# Patient Record
Sex: Male | Born: 1970 | Race: White | Hispanic: No | Smoking: Former smoker
Health system: Southern US, Community
[De-identification: ages and names within clinical notes are randomized; demographics above are authoritative.]

## PROBLEM LIST (undated history)

## (undated) DIAGNOSIS — N189 Chronic kidney disease, unspecified: Secondary | ICD-10-CM

## (undated) DIAGNOSIS — Z87442 Personal history of urinary calculi: Secondary | ICD-10-CM

## (undated) HISTORY — PX: LITHOTRIPSY: SUR834

---

## 1998-09-19 HISTORY — PX: STONE EXTRACTION WITH BASKET: SHX5318

## 2003-03-13 ENCOUNTER — Encounter: Admission: RE | Admit: 2003-03-13 | Discharge: 2003-03-13 | Payer: Self-pay | Admitting: Family Medicine

## 2003-03-13 ENCOUNTER — Encounter: Payer: Self-pay | Admitting: Family Medicine

## 2005-01-20 ENCOUNTER — Encounter: Admission: RE | Admit: 2005-01-20 | Discharge: 2005-01-20 | Payer: Self-pay | Admitting: Emergency Medicine

## 2005-01-28 ENCOUNTER — Encounter: Admission: RE | Admit: 2005-01-28 | Discharge: 2005-01-28 | Payer: Self-pay | Admitting: Emergency Medicine

## 2005-07-12 ENCOUNTER — Encounter: Admission: RE | Admit: 2005-07-12 | Discharge: 2005-07-12 | Payer: Self-pay | Admitting: Emergency Medicine

## 2005-10-27 ENCOUNTER — Ambulatory Visit (HOSPITAL_COMMUNITY): Admission: RE | Admit: 2005-10-27 | Discharge: 2005-10-27 | Payer: Self-pay | Admitting: Urology

## 2007-06-24 IMAGING — CR DG ABDOMEN 1V
2 series · 2 of 2 positions shown · non-contrast
Comparison: none

CLINICAL DATA: Left ureteral stone, preop workup.
ABDOMEN - 1 VIEW:
Faint, small, oval calcification, left true pelvis, which appears to measure approximately 4 x 7 mm. Unremarkable bowel gas pattern.  
I compared the 10/06/05 CT scan on the [facility] to today?s plain films.  On the CT, the stone was in the distal left ureter at approximately the mid SI joint level.  On the plain films, there is a faint calcification just above the level of the ischial spine, in the true pelvis, not far from the expected position of the left UVJ.  I feel that the stone has moved distally to a slight degree since the prior CT.

[t abdomen supine (1 of 2)]
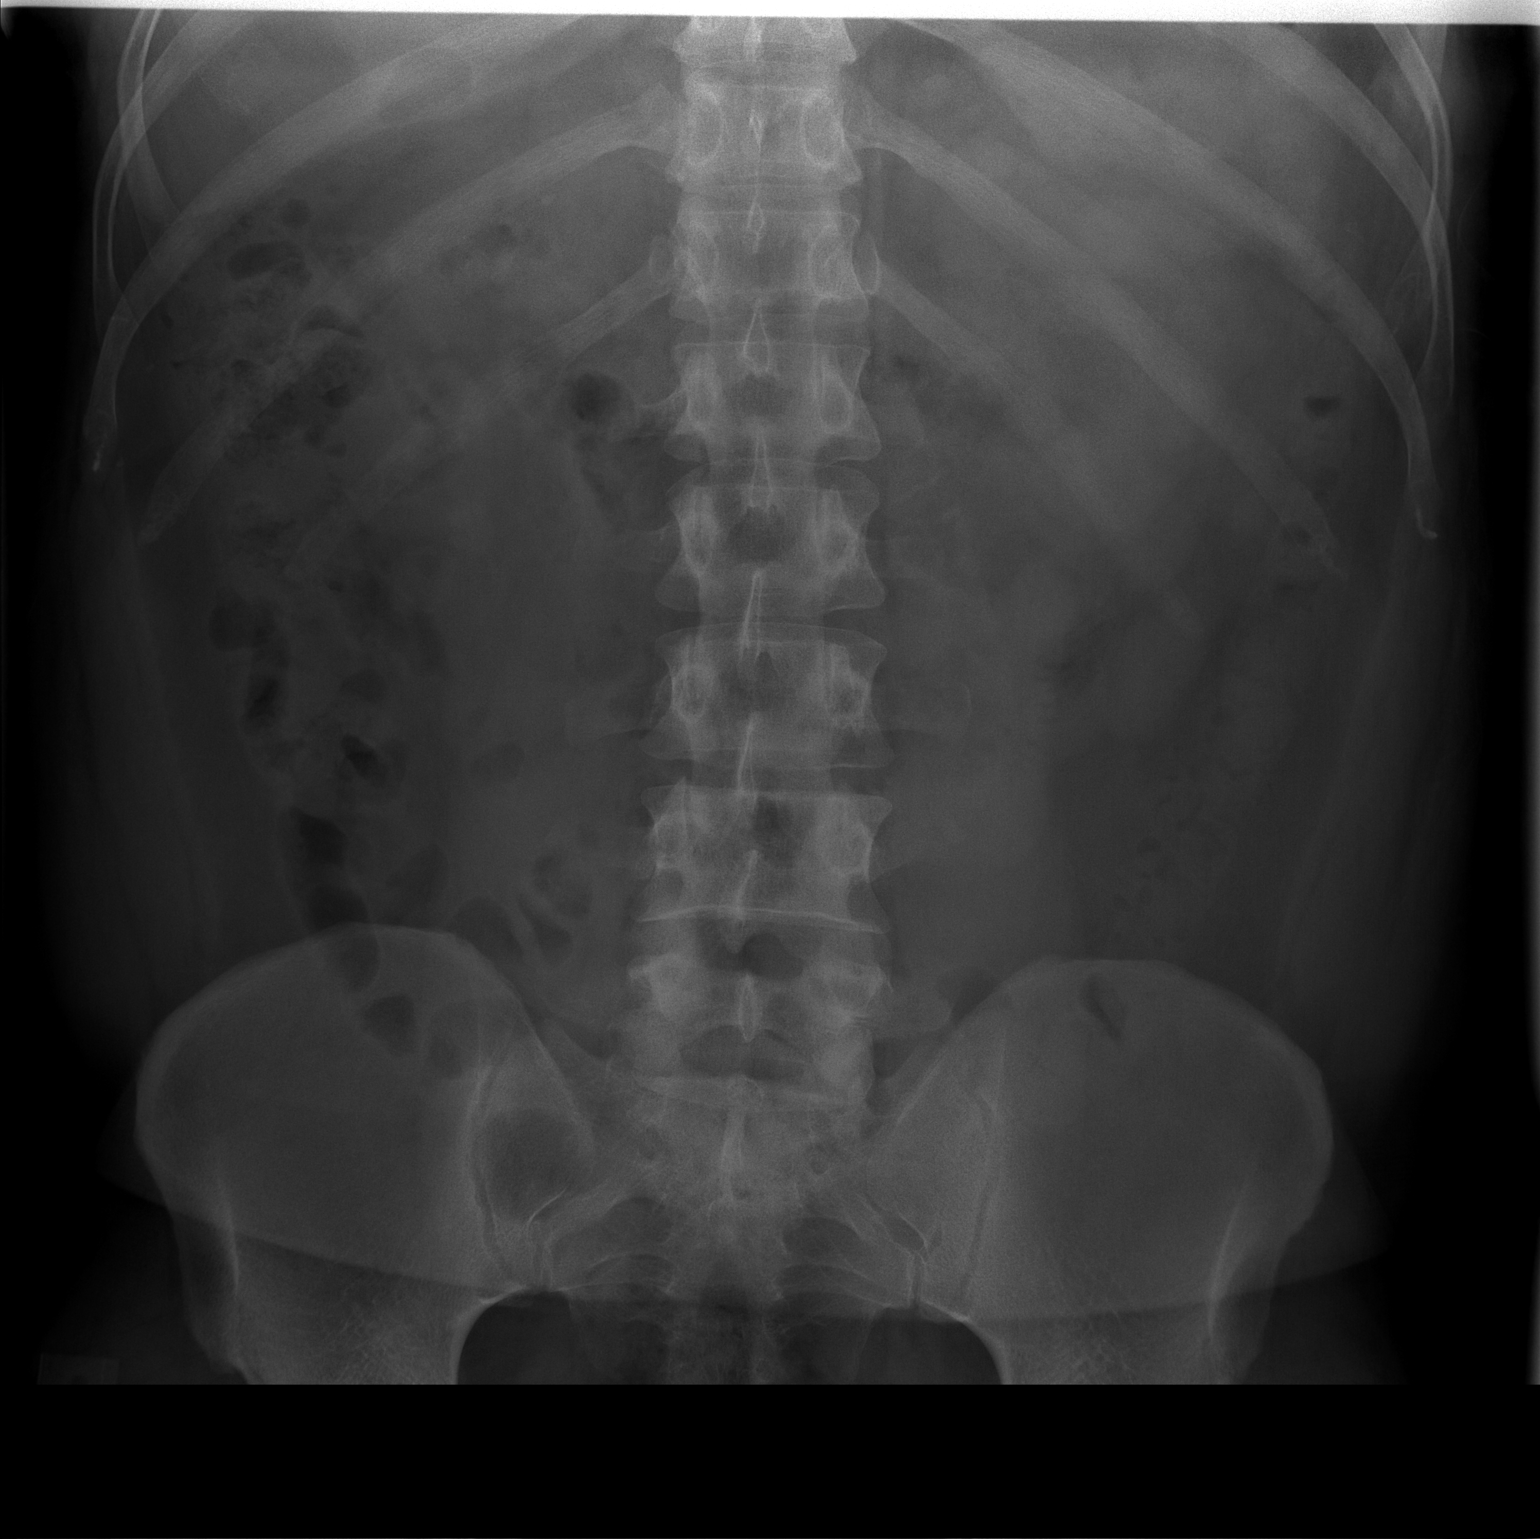

[t abdomen supine (2 of 2)]
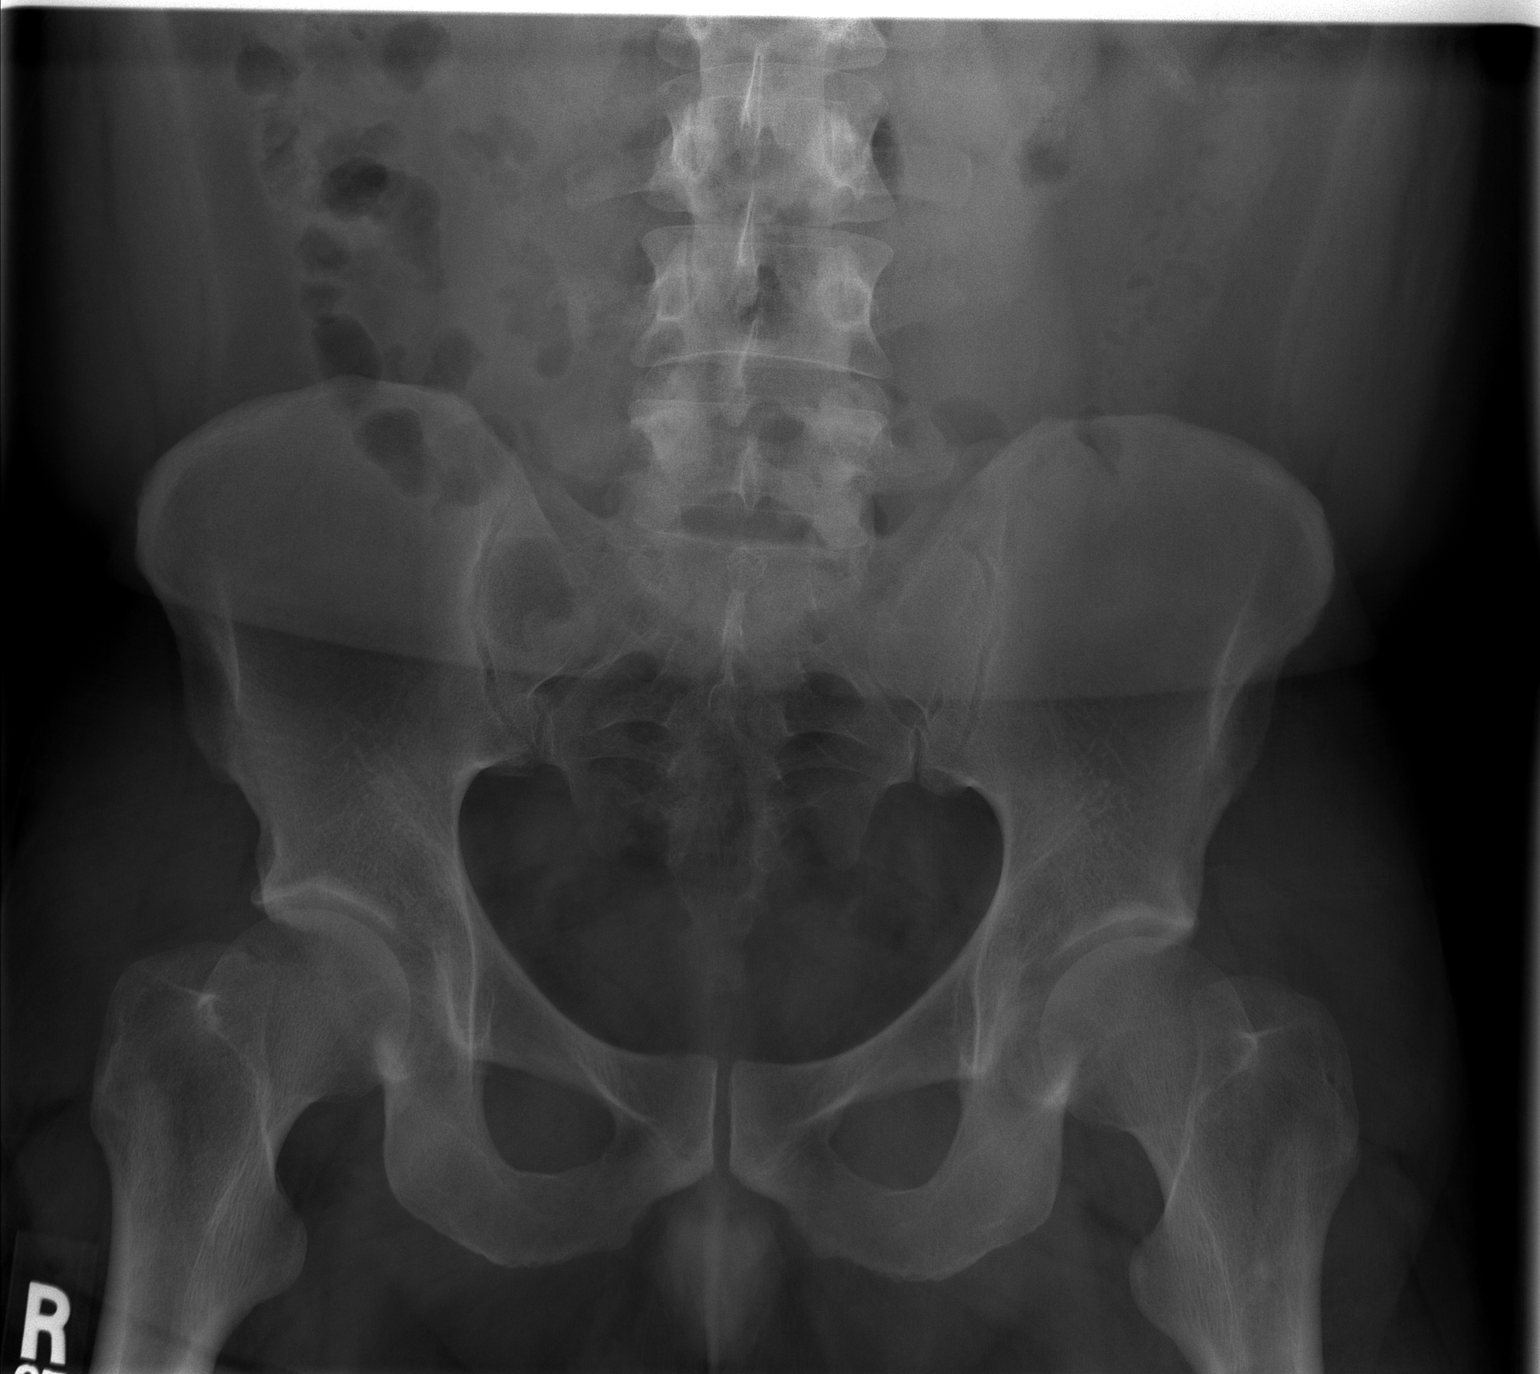

[2 of 2 positions shown; findings below may reference images not displayed]

IMPRESSION: Findings suspicious for small calculus in the distal left ureter near the UVJ.

## 2011-09-26 ENCOUNTER — Encounter (HOSPITAL_COMMUNITY): Payer: Self-pay | Admitting: Pharmacy Technician

## 2011-09-27 ENCOUNTER — Other Ambulatory Visit: Payer: Self-pay | Admitting: Urology

## 2011-09-27 ENCOUNTER — Encounter (HOSPITAL_COMMUNITY): Payer: Self-pay | Admitting: *Deleted

## 2011-09-27 NOTE — Progress Notes (Signed)
Reminded to take laxitive 09/28/11 pm and no Aspirin products from now till after procedure

## 2011-09-29 ENCOUNTER — Encounter (HOSPITAL_COMMUNITY)
Admission: RE | Disposition: A | Discharge status: Home / Self Care | Payer: Self-pay | Source: Ambulatory Visit | Attending: Urology

## 2011-09-29 ENCOUNTER — Ambulatory Visit (HOSPITAL_COMMUNITY): Payer: BC Managed Care – PPO

## 2011-09-29 ENCOUNTER — Encounter (HOSPITAL_COMMUNITY): Payer: Self-pay | Admitting: *Deleted

## 2011-09-29 ENCOUNTER — Ambulatory Visit (HOSPITAL_COMMUNITY)
Admission: RE | Admit: 2011-09-29 | Discharge: 2011-09-29 | Disposition: A | Discharge status: Home / Self Care | Payer: BC Managed Care – PPO | Source: Ambulatory Visit | Attending: Urology | Admitting: Urology

## 2011-09-29 DIAGNOSIS — N201 Calculus of ureter: Secondary | ICD-10-CM

## 2011-09-29 HISTORY — DX: Chronic kidney disease, unspecified: N18.9

## 2011-09-29 SURGERY — LITHOTRIPSY, ESWL
Anesthesia: LOCAL | Laterality: Left

## 2011-09-29 MED ORDER — TAMSULOSIN HCL 0.4 MG PO CAPS: 0.4000 mg | ORAL_CAPSULE | ORAL | Status: AC

## 2011-09-29 MED ORDER — DIAZEPAM 5 MG PO TABS
10.0000 mg | ORAL_TABLET | ORAL | Status: AC
  Administered 2011-09-29: 10 mg via ORAL

## 2011-09-29 MED ORDER — DIPHENHYDRAMINE HCL 25 MG PO CAPS
25.0000 mg | ORAL_CAPSULE | ORAL | Status: AC
  Administered 2011-09-29: 25 mg via ORAL

## 2011-09-29 MED ORDER — HYDROCODONE-ACETAMINOPHEN 10-325 MG PO TABS: 1.0000 | ORAL_TABLET | Freq: Four times a day (QID) | ORAL | Status: AC | PRN

## 2011-09-29 MED ORDER — CIPROFLOXACIN HCL 500 MG PO TABS
500.0000 mg | ORAL_TABLET | ORAL | Status: AC
  Administered 2011-09-29: 500 mg via ORAL

## 2011-09-29 MED ORDER — SODIUM CHLORIDE 0.45 % IV SOLN
INTRAVENOUS | Status: DC
  Administered 2011-09-29: 1000 mL via INTRAVENOUS

## 2011-09-29 NOTE — Interval H&P Note (Signed)
History and Physical Interval Note:  09/29/2011 4:12 PM  Joshua Wilkinson  has presented today for surgery, with the diagnosis of left distal ureteral stone  The various methods of treatment have been discussed with the patient and family. After consideration of risks, benefits and other options for treatment, the patient has consented to  Procedure(s): EXTRACORPOREAL SHOCK WAVE LITHOTRIPSY (ESWL) as a surgical intervention .  The patients' history has been reviewed, patient examined, no change in status, stable for surgery.  I have reviewed the patients' chart and labs.  Questions were answered to the patient's satisfaction.     [place]

## 2011-09-29 NOTE — H&P (Signed)
  History of Present Illness     He has a history of calculus disease. He passed a stone from the right ureter in 2/08.   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  Interval history: He has not seen his stone pass. He has been having discomfort almost on a nightly basis but it's not severe pain. It has required pain medication.   Past Medical History Problems  1. 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 Hydrocodone-Acetaminophen 10-325 MG Oral Tablet; TAKE 1-2 TABLETS EVERY 6 HOURS AS  NEEDED; Therapy: 29Nov2012 to (Complete:05Dec2012); Last Rx:29Nov2012 2. Meloxicam 15 MG Oral Tablet; Therapy: 10Oct2012 to 3. Multivitamins TABS; Therapy: (Recorded:04Feb2008) to  Allergies Medication  1. No Known Drug Allergies  Family History Problems  1. Paternal history of  Family Health Status Number Of Children 1 son 2. Paternal history of  Nephrolithiasis  Social History Problems  1. Alcohol Use less than one 2. Caffeine Use 1-2 3. 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 History of  Tobacco Use V15.82 quit 2 years ago; smoked less than one pack per day  Review of Systems Genitourinary, constitutional and gastrointestinal system(s) were reviewed and pertinent findings if present are noted.  Genitourinary: hematuria.  Gastrointestinal: flank pain, but no nausea and no vomiting.  Constitutional: no fever.    Vitals Vital Signs Blood Pressure: 153 / 109 Temperature: 97.7 F Heart Rate: 63  Physical Exam: General appearance: alert and appears stated age Head: Normocephalic, without obvious abnormality, atraumatic Eyes: conjunctivae/corneas clear. EOM's intact.  Oropharynx: moist mucous membranes Neck: supple, symmetrical, trachea midline Resp: normal  respiratory effort Cardio: regular rate and rhythm Back: symmetric, no curvature. ROM normal. No CVA tenderness. GI: soft, non-tender; bowel sounds normal; no masses,  no organomegaly Male genitalia: penis: normal male phallus with no lesions or discharge. Testes: bilaterally descended with no masses or tenderness. no hernias Extremities: extremities normal, atraumatic, no cyanosis or edema Skin: Skin color normal. No visible rashes or lesions Neurologic: Grossly normal  The following images/tracing/specimen were independently visualized:  KUB: His left distal ureteral stone has progressed somewhat and is now located above the ureterovesical junction.    Assessment Assessed  1. Distal Ureteral Stone On The Left 592.1 2. Nephrolithiasis Of The Left Kidney 592.0   He has not passed his stone yet and has been having intermittent discomfort. He therefore has elected to proceed with lithotripsy. We've gone over the procedure today as well as its risks, complications and the probability of success. He understands and has elected to proceed.   Plan Distal Ureteral Stone On The Left (592.1)  1. Hydrocodone-Acetaminophen 10-325 MG Oral Tablet; TAKE 1-2 TABLETS EVERY 6 HOURS AS  NEEDED; Therapy: 29Nov2012 to (Complete:08Jan2013)  Requested for: 02Jan2013; Last  Rx:02Jan2013 Health Maintenance (V70.0)  2. UA With REFLEX  Done: 02Jan2013 08:06AM   1. I refilled his pain medication. 2. He will be scheduled for lithotripsy of his left distal ureteral stone.

## 2011-09-29 NOTE — Op Note (Signed)
See [facility] OP note scanned into chart. 

## 2013-05-26 IMAGING — CR DG ABDOMEN 1V
2 series · 2 of 2 positions shown · non-contrast
Comparison: 09/21/2011

CLINICAL DATA: Left ureteral stone. Preop for lithotripsy.

ABDOMEN - 1 VIEW

[t abdomen supine (1 of 2)]
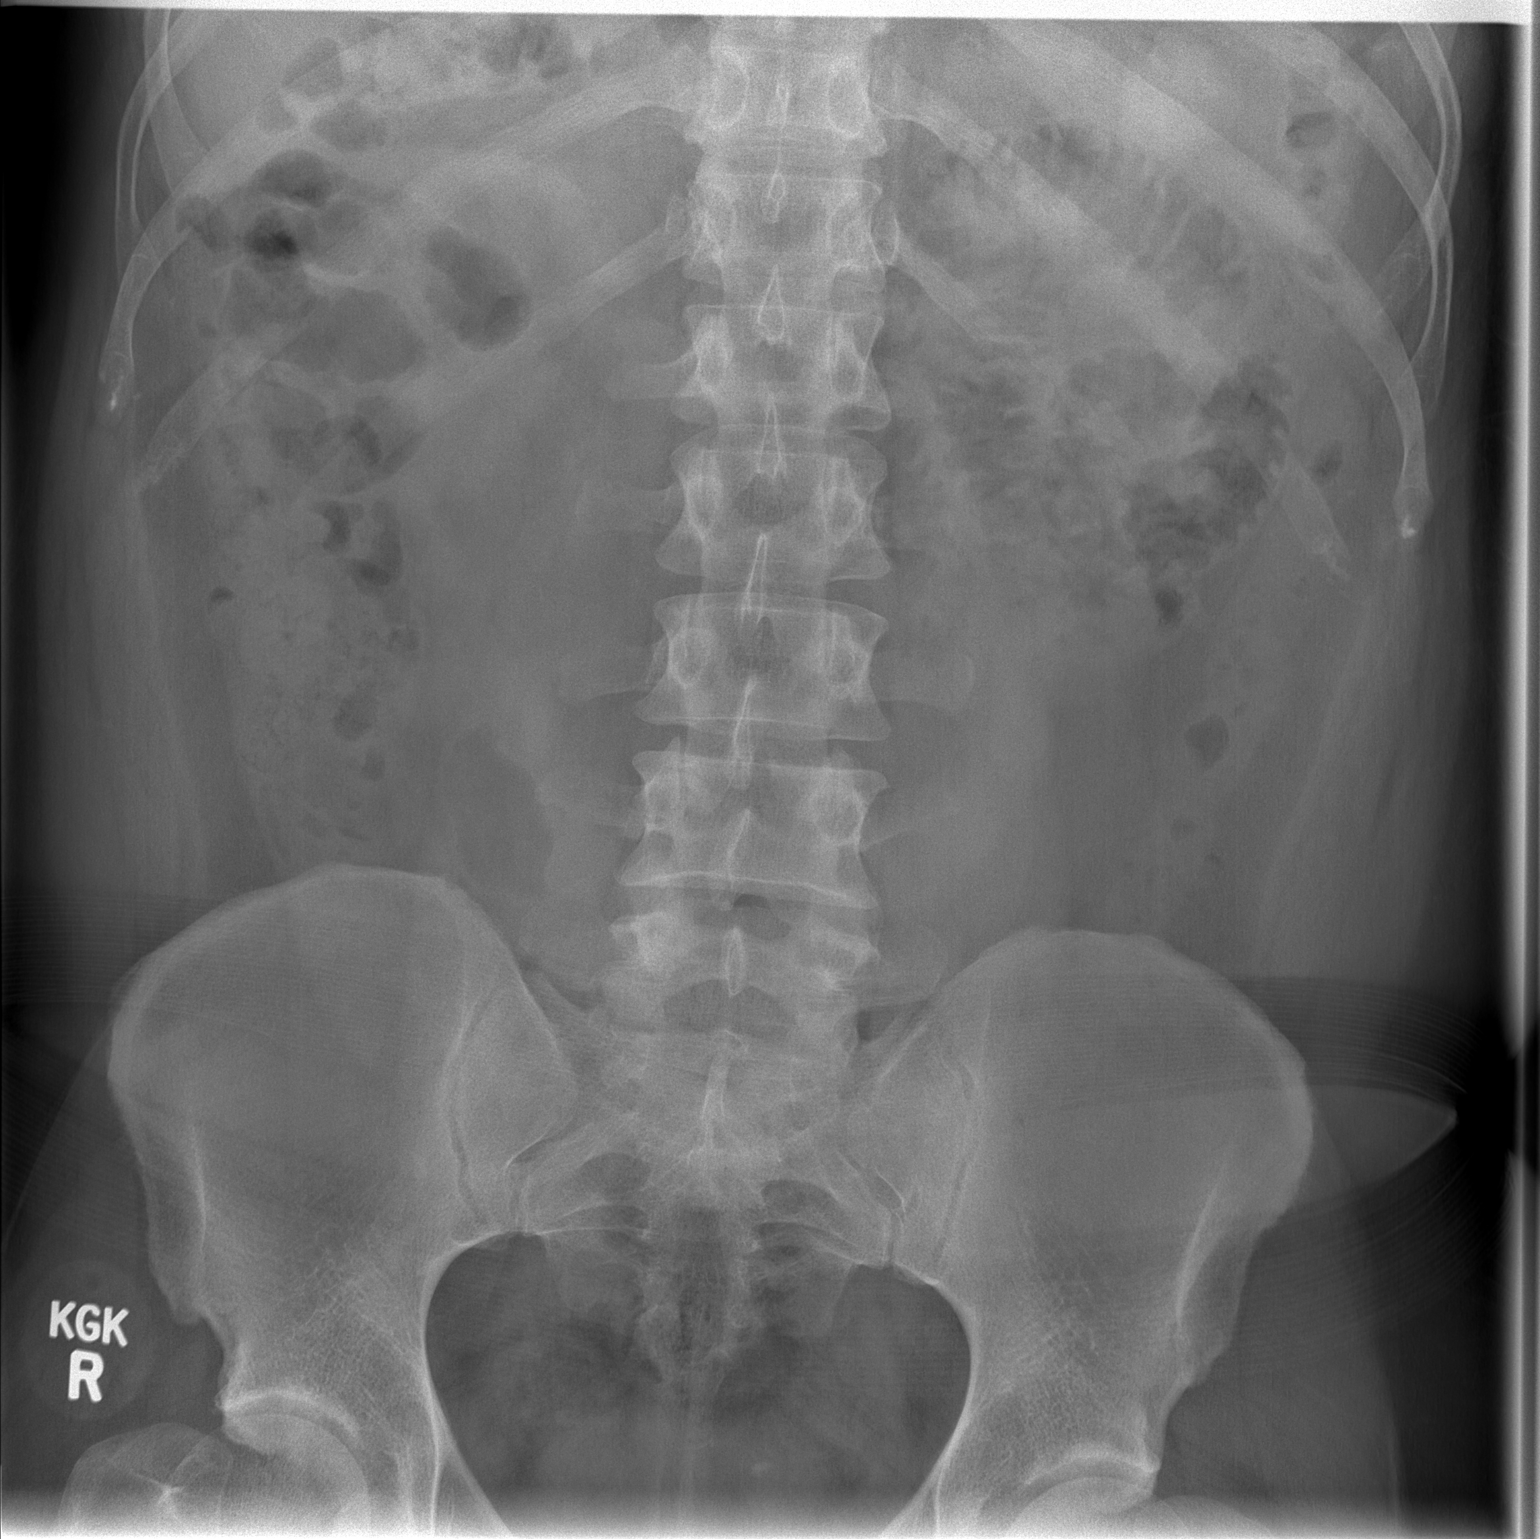

[t abdomen supine (2 of 2)]
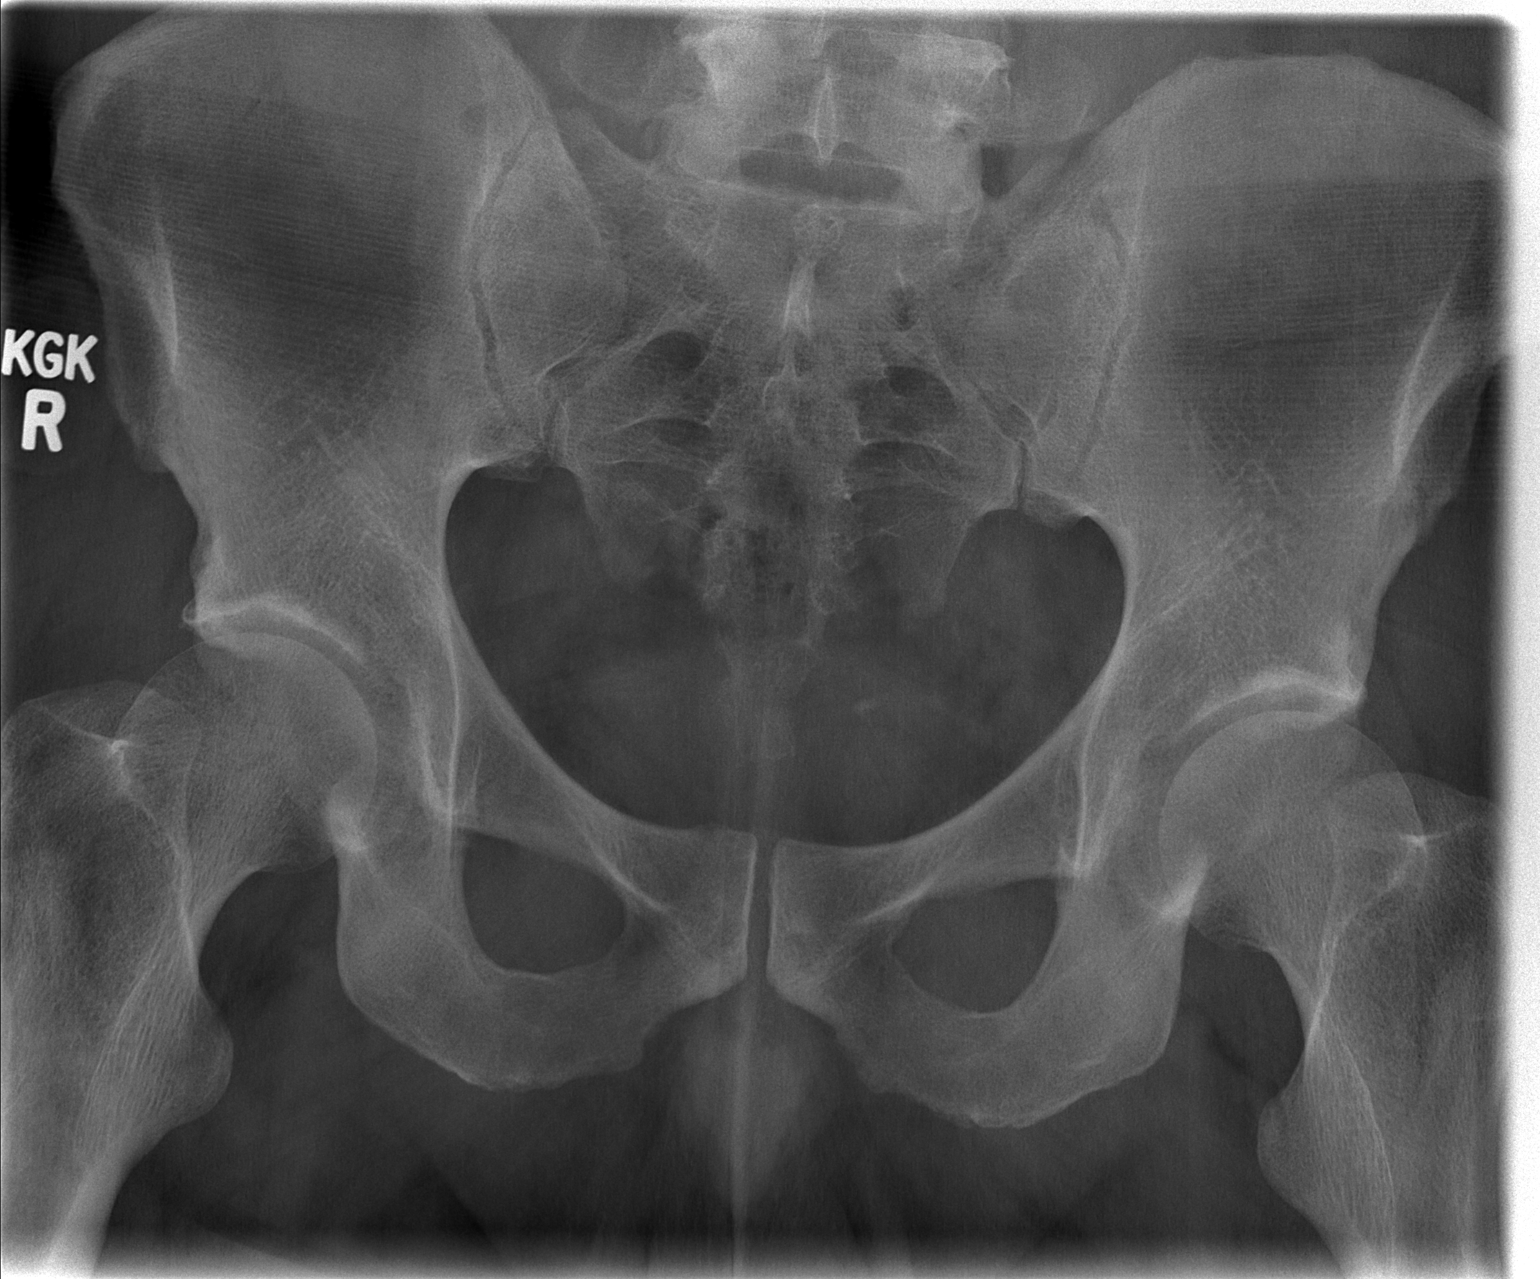

[2 of 2 positions shown; findings below may reference images not displayed]

FINDINGS: A 5 mm calculus is again seen in the left pelvis in
expected position of the left UVJ.  This is unchanged in position
since previous study.  No other radiopaque calculi identified.  No
evidence of dilated bowel loops.
IMPRESSION: No change in position of the 5 mm distal left ureteral calculus in
expected position of the left UVJ.

## 2013-09-19 HISTORY — PX: KNEE ARTHROSCOPY: SUR90

## 2018-03-28 ENCOUNTER — Other Ambulatory Visit: Payer: Self-pay | Admitting: Urology

## 2018-04-05 ENCOUNTER — Ambulatory Visit (HOSPITAL_COMMUNITY)
Admission: RE | Admit: 2018-04-05 | Discharge: 2018-04-05 | Disposition: A | Discharge status: Home / Self Care | Payer: Managed Care, Other (non HMO) | Source: Ambulatory Visit | Attending: Urology | Admitting: Urology

## 2018-04-05 ENCOUNTER — Encounter (HOSPITAL_COMMUNITY): Payer: Self-pay | Admitting: General Practice

## 2018-04-05 ENCOUNTER — Ambulatory Visit (HOSPITAL_COMMUNITY): Payer: Managed Care, Other (non HMO)

## 2018-04-05 ENCOUNTER — Encounter (HOSPITAL_COMMUNITY)
Admission: RE | Disposition: A | Discharge status: Home / Self Care | Payer: Self-pay | Source: Ambulatory Visit | Attending: Urology

## 2018-04-05 DIAGNOSIS — Z87891 Personal history of nicotine dependence: Secondary | ICD-10-CM | POA: Insufficient documentation

## 2018-04-05 DIAGNOSIS — N202 Calculus of kidney with calculus of ureter: Secondary | ICD-10-CM | POA: Diagnosis present

## 2018-04-05 DIAGNOSIS — N201 Calculus of ureter: Secondary | ICD-10-CM

## 2018-04-05 DIAGNOSIS — Z95810 Presence of automatic (implantable) cardiac defibrillator: Secondary | ICD-10-CM | POA: Diagnosis not present

## 2018-04-05 DIAGNOSIS — Z87442 Personal history of urinary calculi: Secondary | ICD-10-CM | POA: Diagnosis not present

## 2018-04-05 HISTORY — DX: Personal history of urinary calculi: Z87.442

## 2018-04-05 HISTORY — PX: EXTRACORPOREAL SHOCK WAVE LITHOTRIPSY: SHX1557

## 2018-04-05 SURGERY — LITHOTRIPSY, ESWL
Anesthesia: LOCAL | Laterality: Right

## 2018-04-05 MED ORDER — DIAZEPAM 5 MG PO TABS
10.0000 mg | ORAL_TABLET | ORAL | Status: AC
  Administered 2018-04-05: 10 mg via ORAL
  Filled 2018-04-05: qty 2

## 2018-04-05 MED ORDER — SODIUM CHLORIDE 0.9 % IV SOLN
INTRAVENOUS | Status: DC
  Administered 2018-04-05: 08:00:00 via INTRAVENOUS

## 2018-04-05 MED ORDER — CIPROFLOXACIN HCL 500 MG PO TABS
500.0000 mg | ORAL_TABLET | ORAL | Status: AC
  Administered 2018-04-05: 500 mg via ORAL
  Filled 2018-04-05: qty 1

## 2018-04-05 MED ORDER — DIPHENHYDRAMINE HCL 25 MG PO CAPS
25.0000 mg | ORAL_CAPSULE | ORAL | Status: AC
  Administered 2018-04-05: 25 mg via ORAL
  Filled 2018-04-05: qty 1

## 2018-04-05 NOTE — H&P (Signed)
I have a history of kidney stones.  HPI: Joshua Wilkinson is a 47 year-old male established patient who is here for F/U due to a history of renal calculi.  The patient was last seen 09/26/2017. The patient's stone was on his right side. He did not pass a stone since the last office visit. The patient has been taking Indapamide 2.5 mg for their calculus disease.   The patient has had flank pain since they were last seen. The patient denies any progressive voiding symptoms. He has no seen blood in his urine since the last visit. He is not currently having flank pain, back pain, groin pain, nausea, vomiting, fever or chills. He has had ESWL for treatment of his stones in the past. This condition would be considered of mild to moderate severity with no modifying factors or associated signs or symptoms other than as noted above.   03/28/18: 2 weeks ago he experienced acute onset right flank pain. It was pretty severe. It was not relieved by positional change. It was not associated with any hematuria. Pain has since resolved and has not recurred. He has not seen any stones pass.     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 I will obtain serum and 24 hr urine studies and then have him return to go over those results. - 08/30/2017, Kidney stone on left side, - 2016 Ureteral calculus (Improving), Right, He passed  his right ureteral stone. He is asymptomatic now. No stone was seen anywhere along the course of the ureter on the right-hand side on his KUB today. The stone in his right kidney remains unchanged. No new calcifications are noted overlying the right or left kidney. - 08/30/2017 Dorsalgia, Unspec - 07/24/2017      PMH Notes: Calculus disease: He has a history of calculus disease. He passed a stone from the right ureter in 2/08.  ESWL 1/13  KUB 1/14 - no renal calculi.  Stone analysis: Calcium oxalate 89% and calcium phosphate 11%.  Repeat stone analysis: Calcium oxalate.  Metabolic stone evaluation: His serum studies were normal. 24-hour urine revealed primary risk factors of hypercalciuria, hyperoxaluria with a good total volume of 2.2 L.  Treatment: Indapamide 2.5 mg q. day.   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 Margins were negative and he has had no recurrence   He underwent vasectomy on 08/02/13.     NON-GU PMH: Hypercalciuria Hyperoxaluria    FAMILY HISTORY: Family Health Status Number - Father nephrolithiasis - Father   SOCIAL HISTORY: Marital Status: Divorced Preferred Language: English; Ethnicity: Not Hispanic Or Latino; Race: White Current Smoking Status: Patient does not smoke anymore. Has not smoked since 12/19/2006.   Tobacco Use Assessment Completed: Used  Tobacco in last 30 days? Drinks 1 drink per month.  Does not drink caffeine.     Notes: Former smoker, Alcohol Use, Tobacco Use, Marital History - Divorced, Caffeine Use, Occupation:   REVIEW OF SYSTEMS:    GU Review Male:   Patient denies frequent urination, hard to postpone urination, burning/ pain with urination, get up at night to urinate, leakage of urine, stream starts and stops, trouble starting your stream, have to strain to urinate , erection problems, and penile pain.  Gastrointestinal  (Upper):   Patient denies nausea, vomiting, and indigestion/ heartburn.  Gastrointestinal (Lower):   Patient denies diarrhea and constipation.  Constitutional:   Patient denies fever, night sweats, weight loss, and fatigue.  Skin:   Patient denies skin rash/ lesion and itching.  Eyes:   Patient denies blurred vision and double vision.  Ears/ Nose/ Throat:   Patient denies sore throat and sinus problems.  Hematologic/Lymphatic:   Patient denies swollen glands and easy bruising.  Cardiovascular:   Patient denies leg swelling and chest pains.  Respiratory:   Patient denies cough and shortness of breath.  Endocrine:   Patient denies excessive thirst.  Musculoskeletal:   Patient denies back pain and joint pain.  Neurological:   Patient denies headaches and dizziness.  Psychologic:   Patient denies depression and anxiety.   VITAL SIGNS:      03/28/2018 08:02 AM  Weight 222 lb / 100.7 kg  Height 72 in / 182.88 cm  BP 141/87 mmHg  Pulse 65 /min  BMI 30.1 kg/m   PAST DATA REVIEWED:  Source Of History:  Patient  Lab Test Review:   BUN/Creatinine, Calcium  Records Review:   Previous Patient Records, POC Tool  X-Ray Review: KUB: Reviewed Films. Previous KUB images were reviewed and compared with today's study. [facility] Protocol: Reviewed Films. Previous CT scan images were reviewed and compared with today's study.    07/13/16  PSA  Total PSA 1.04 ng/dl   Notes:                     His creatinine in 12/18 was 0.5. A serum calcium at that time was 9.7.   PROCEDURES:         KUB - [id number]  A single view of the abdomen is obtained.               Urinalysis w/Scope Dipstick Dipstick Cont'd Micro  Color: Yellow Bilirubin: Neg mg/dL WBC/hpf: 6 - 40/JWJ  Appearance: Clear Ketones: Neg mg/dL RBC/hpf: 0 - 2/hpf  Specific Gravity: 1.025 Blood: Trace ery/uL Bacteria: NS (Not Seen)  pH: <=5.0 Protein: Neg mg/dL Cystals: NS (Not Seen)  Glucose: Neg mg/dL Urobilinogen: 0.2 mg/dL Casts: NS (Not  Seen)    Nitrites: Neg Trichomonas: Not Present    Leukocyte Esterase: Neg leu/uL Mucous: Present      Epithelial Cells: NS (Not Seen)      Yeast: NS (Not Seen)      Sperm: Not Present    ASSESSMENT:      ICD-10 Details  1 GU:   Renal calculus - N20.0 Right, Stable - He has a known history of a right renal calculus that is small and cannot be visualized well on his KUB today. I do not see any stones on the left.  2   Ureteral calculus - N20.1 Right, He has a 9 mm stone in his proximal right ureter. It does not appear to be particularly dense and he has had  lithotripsy in the past which was successful. He did not want to undergo ureteroscopy and therefore will be scheduled for lithotripsy of his right proximal ureteral calculus.              Notes:     We discussed the management of urinary stones. These options include observation, ureteroscopy, shockwave lithotripsy, and PCNL. We discussed which options are relevant to these particular stones. We discussed the natural history of stones as well as the complications of untreated stones and the impact on quality of life without treatment as well as with each of the above listed treatments. We also discussed the efficacy of each treatment in its ability to clear the stone burden. With any of these management options I discussed the signs and symptoms of infection and the need for emergent treatment should these be experienced. For each option we discussed the ability of each procedure to clear the patient of their stone burden.   For observation I described the risks which include but are not limited to silent renal damage, life-threatening infection, need for emergent surgery, failure to pass stone, and pain.   For ureteroscopy I described the risks which include heart attack, stroke, pulmonary embolus, death, bleeding, infection, damage to contiguous structures, positioning injury, ureteral stricture, ureteral avulsion, ureteral injury, need for  ureteral stent, inability to perform ureteroscopy, need for an interval procedure, inability to clear stone burden, stent discomfort and pain.   For shockwave lithotripsy I described the risks which include arrhythmia, kidney contusion, kidney hemorrhage, need for transfusion, long-term risk of diabetes or hypertension, back discomfort, flank ecchymosis, flank abrasion, inability to break up stone, inability to pass stone fragments, Steinstrasse, infection associated with obstructing stones, need for different surgical procedure, need for repeat shockwave lithotripsy, and death.

## 2018-07-09 ENCOUNTER — Encounter (HOSPITAL_COMMUNITY): Payer: Self-pay | Admitting: Urology

## 2019-12-01 IMAGING — CR DG ABDOMEN 1V
1 series · 1 of 1 positions shown · non-contrast
Comparison: March 28, 2018

CLINICAL DATA: Preoperative evaluation.  Right flank pain

EXAM:
ABDOMEN - 1 VIEW

[t abdomen supine]
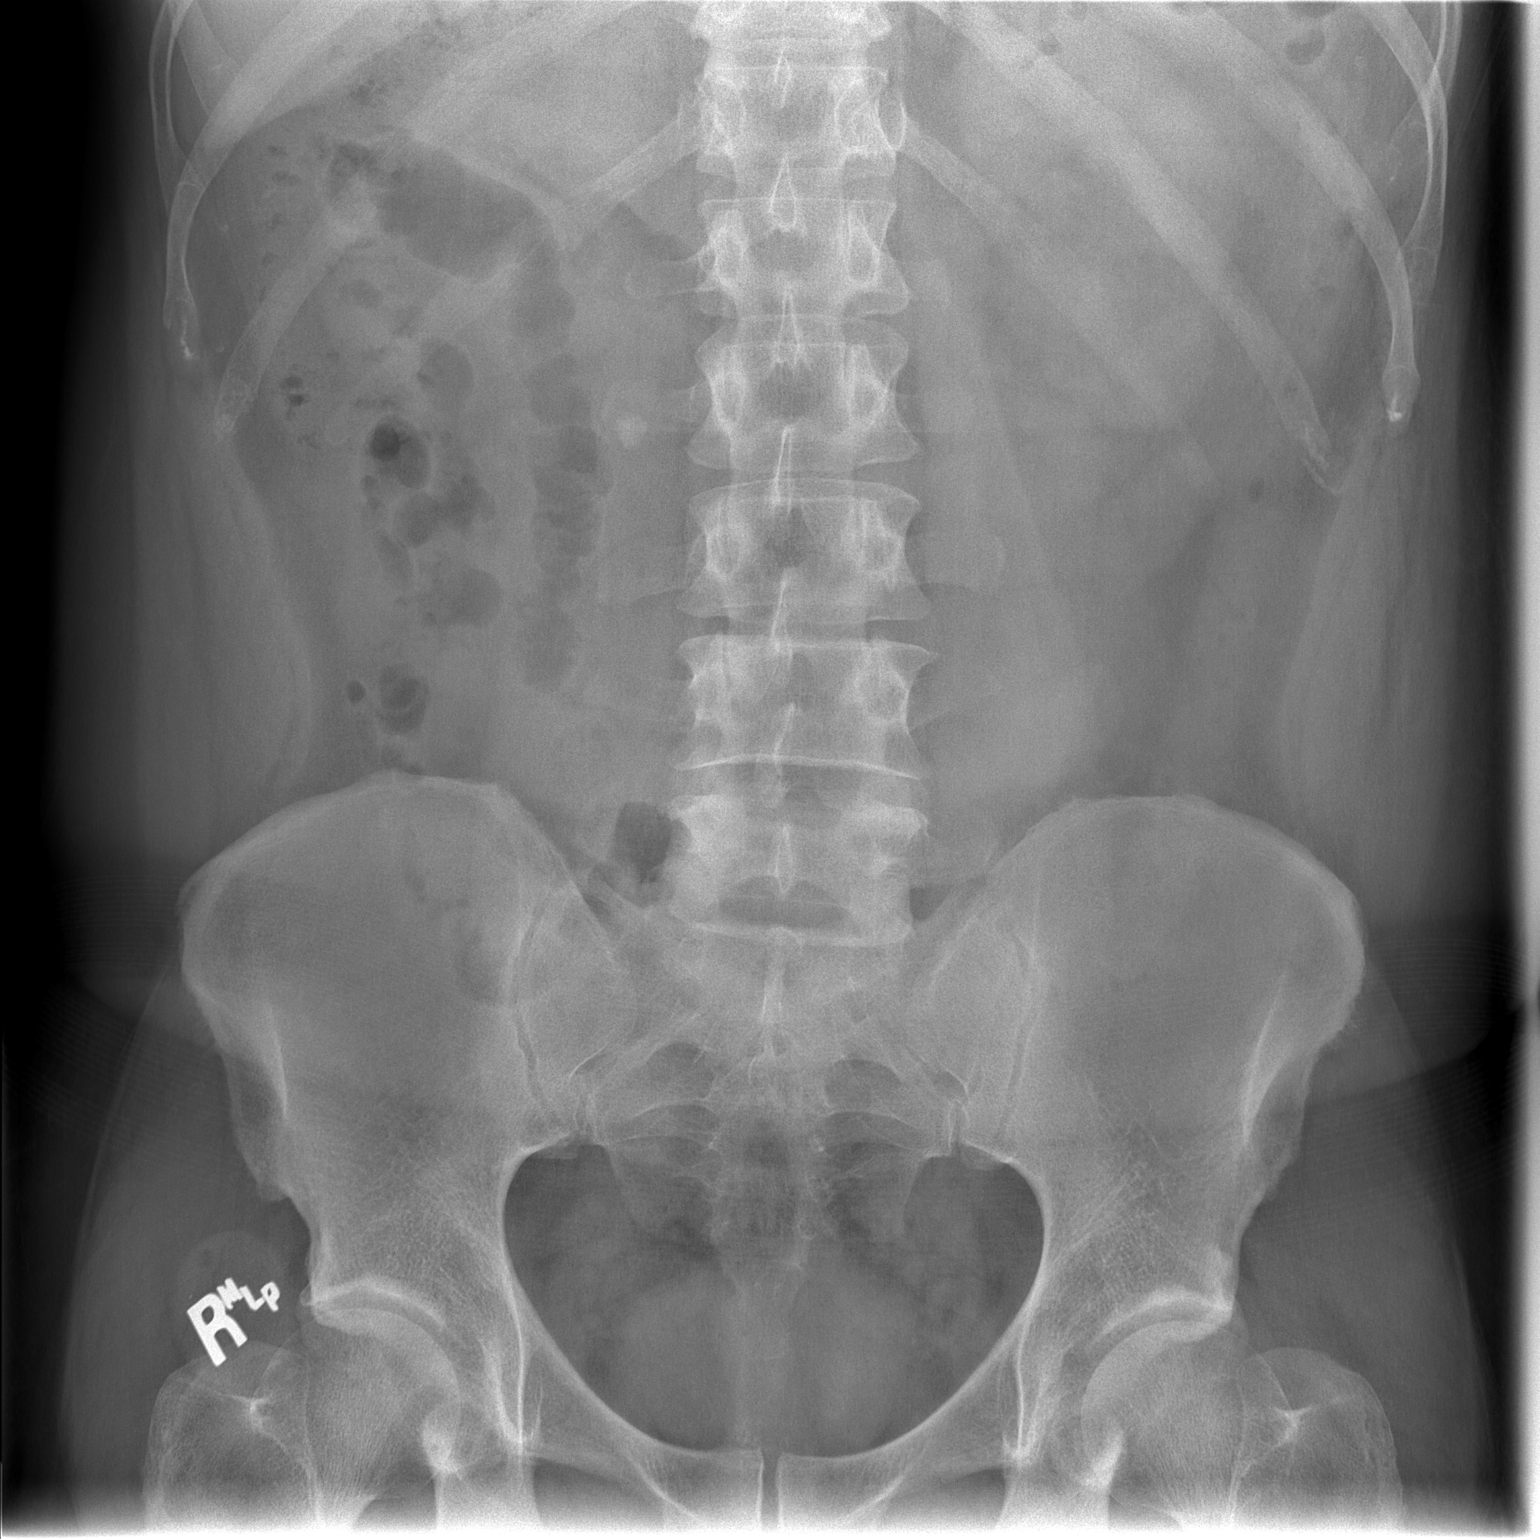

[1 of 1 positions shown; findings below may reference images not displayed]

FINDINGS: There is a persistent 9 x 7 mm calcification overlying the right L2
spinous process, felt to represent a proximal right ureteral
calculus. No new calcifications. There is moderate stool in the
colon. No bowel dilatation or air-fluid level to suggest bowel
obstruction. No free air..
IMPRESSION: 9 x 7 mm proximal right ureteral calculus overlying the L2
transverse process. No new abnormal calcification. No bowel
obstruction or free air evident.
